# Patient Record
Sex: Male | Born: 1965 | Race: White | Hispanic: No | Marital: Married
Health system: Southern US, Community
[De-identification: ages and names within clinical notes are randomized; demographics above are authoritative.]

## PROBLEM LIST (undated history)

## (undated) DIAGNOSIS — E785 Hyperlipidemia, unspecified: Secondary | ICD-10-CM

---

## 2012-07-19 ENCOUNTER — Ambulatory Visit
Admission: RE | Admit: 2012-07-19 | Discharge: 2012-07-19 | Disposition: A | Discharge status: Home / Self Care | Payer: Self-pay | Source: Ambulatory Visit | Attending: Family Medicine | Admitting: Family Medicine

## 2012-07-19 ENCOUNTER — Other Ambulatory Visit: Payer: Self-pay | Admitting: Family Medicine

## 2012-07-19 DIAGNOSIS — M25519 Pain in unspecified shoulder: Secondary | ICD-10-CM

## 2012-07-19 DIAGNOSIS — M25569 Pain in unspecified knee: Secondary | ICD-10-CM

## 2013-10-05 IMAGING — CR DG KNEE 1-2V*L*
2 series · 2 of 2 positions shown · non-contrast
Comparison: None

CLINICAL DATA: Joint pain for 1-2 months

LEFT KNEE - 1-2 VIEW

[t knee ap left]
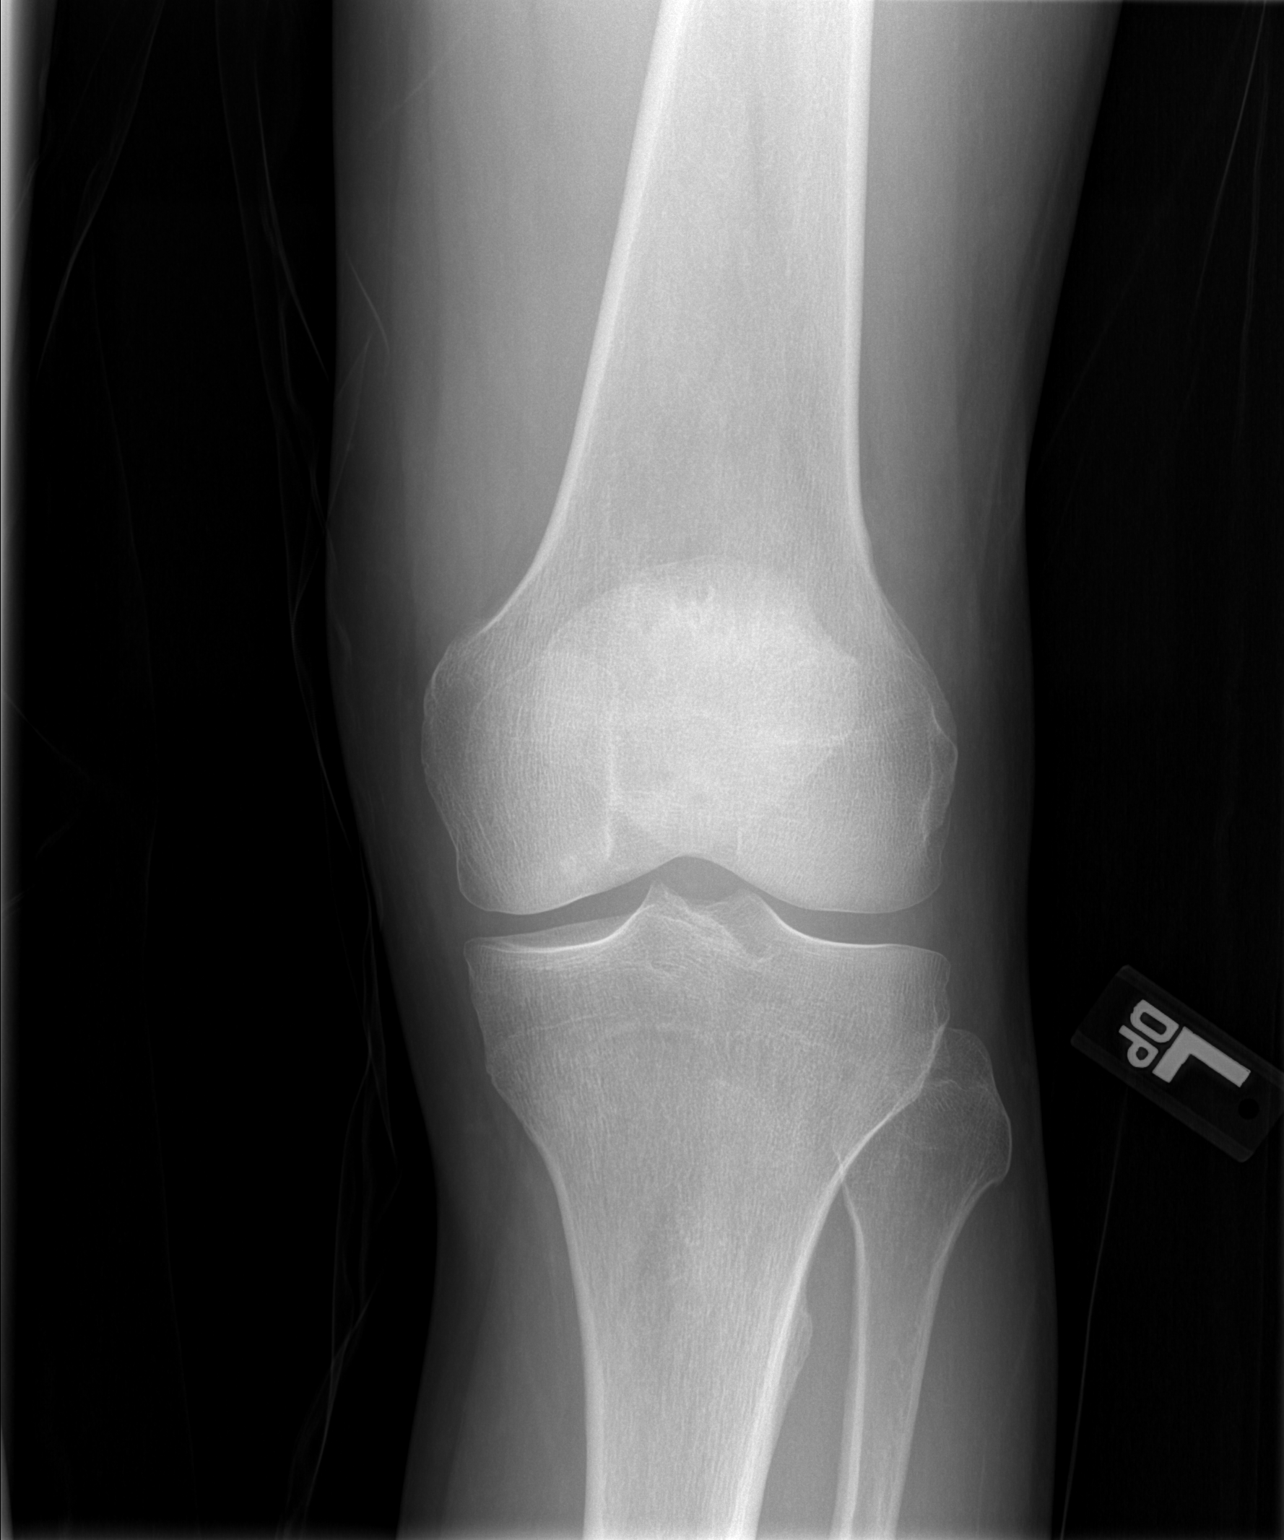

[t knee lat left]
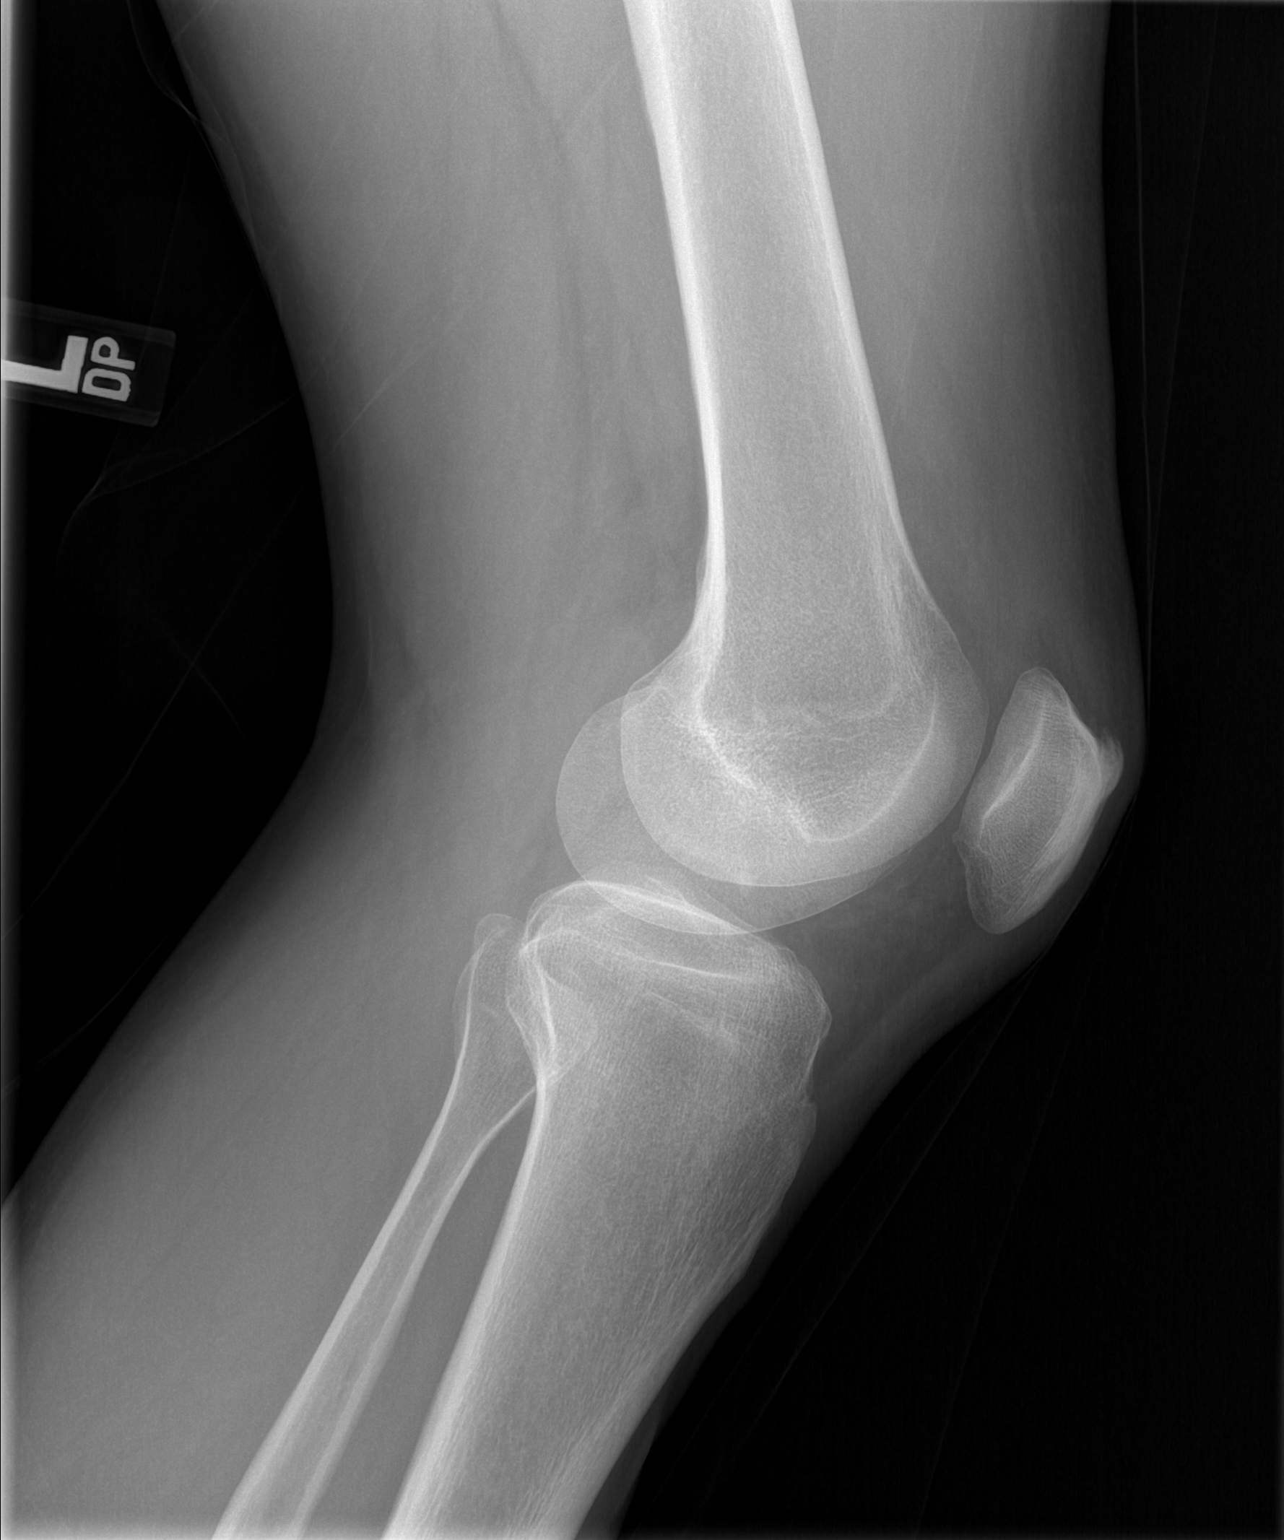

[2 of 2 positions shown; findings below may reference images not displayed]

FINDINGS: Osseous mineralization grossly normal.
Joint spaces preserved.
Small patellar spur at quadriceps tendon insertion.
No acute fracture, dislocation or bone destruction.
No knee joint effusion identified.
IMPRESSION: No acute abnormalities.

## 2013-10-05 IMAGING — CR DG SHOULDER 2+V*R*
3 series · 3 of 3 positions shown · non-contrast
Comparison: None

CLINICAL DATA: Shoulder pain for 1-2 months, no history of trauma

RIGHT SHOULDER - 2+ VIEW

[w shoulder ap internal righ]
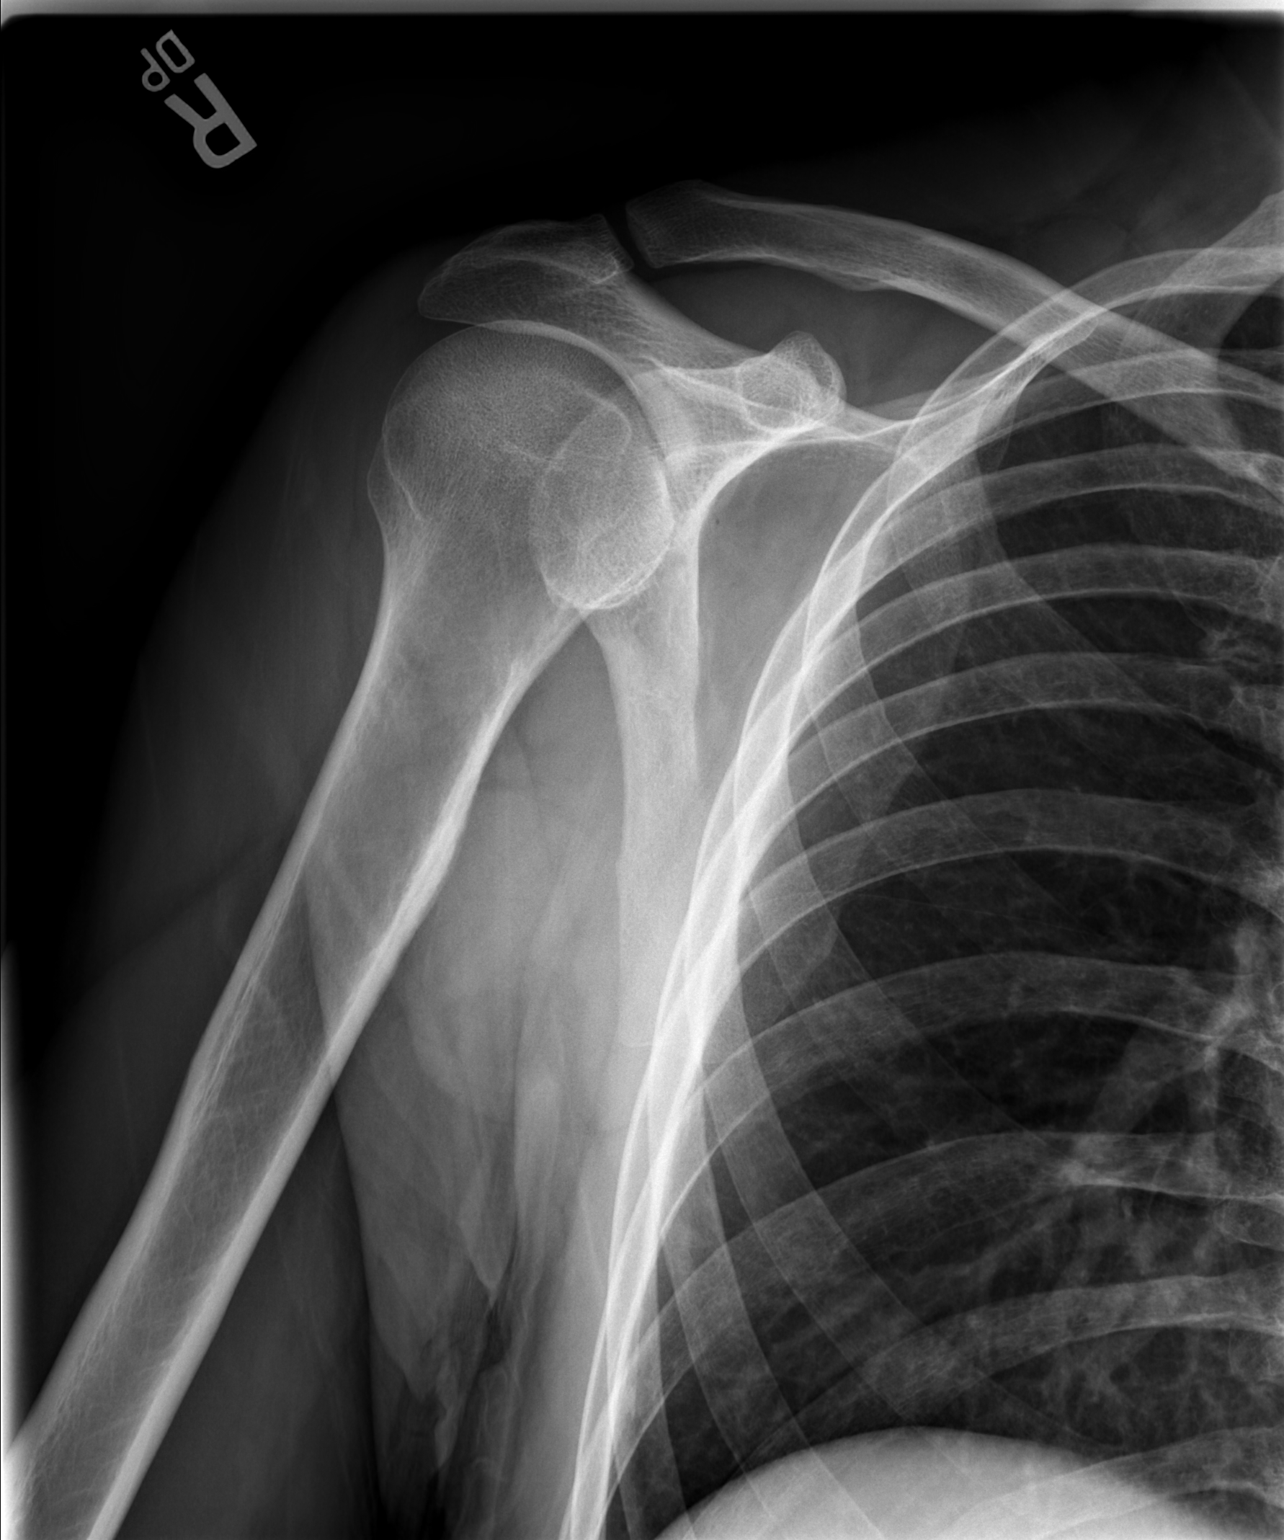

[w shoulder ap external righ]
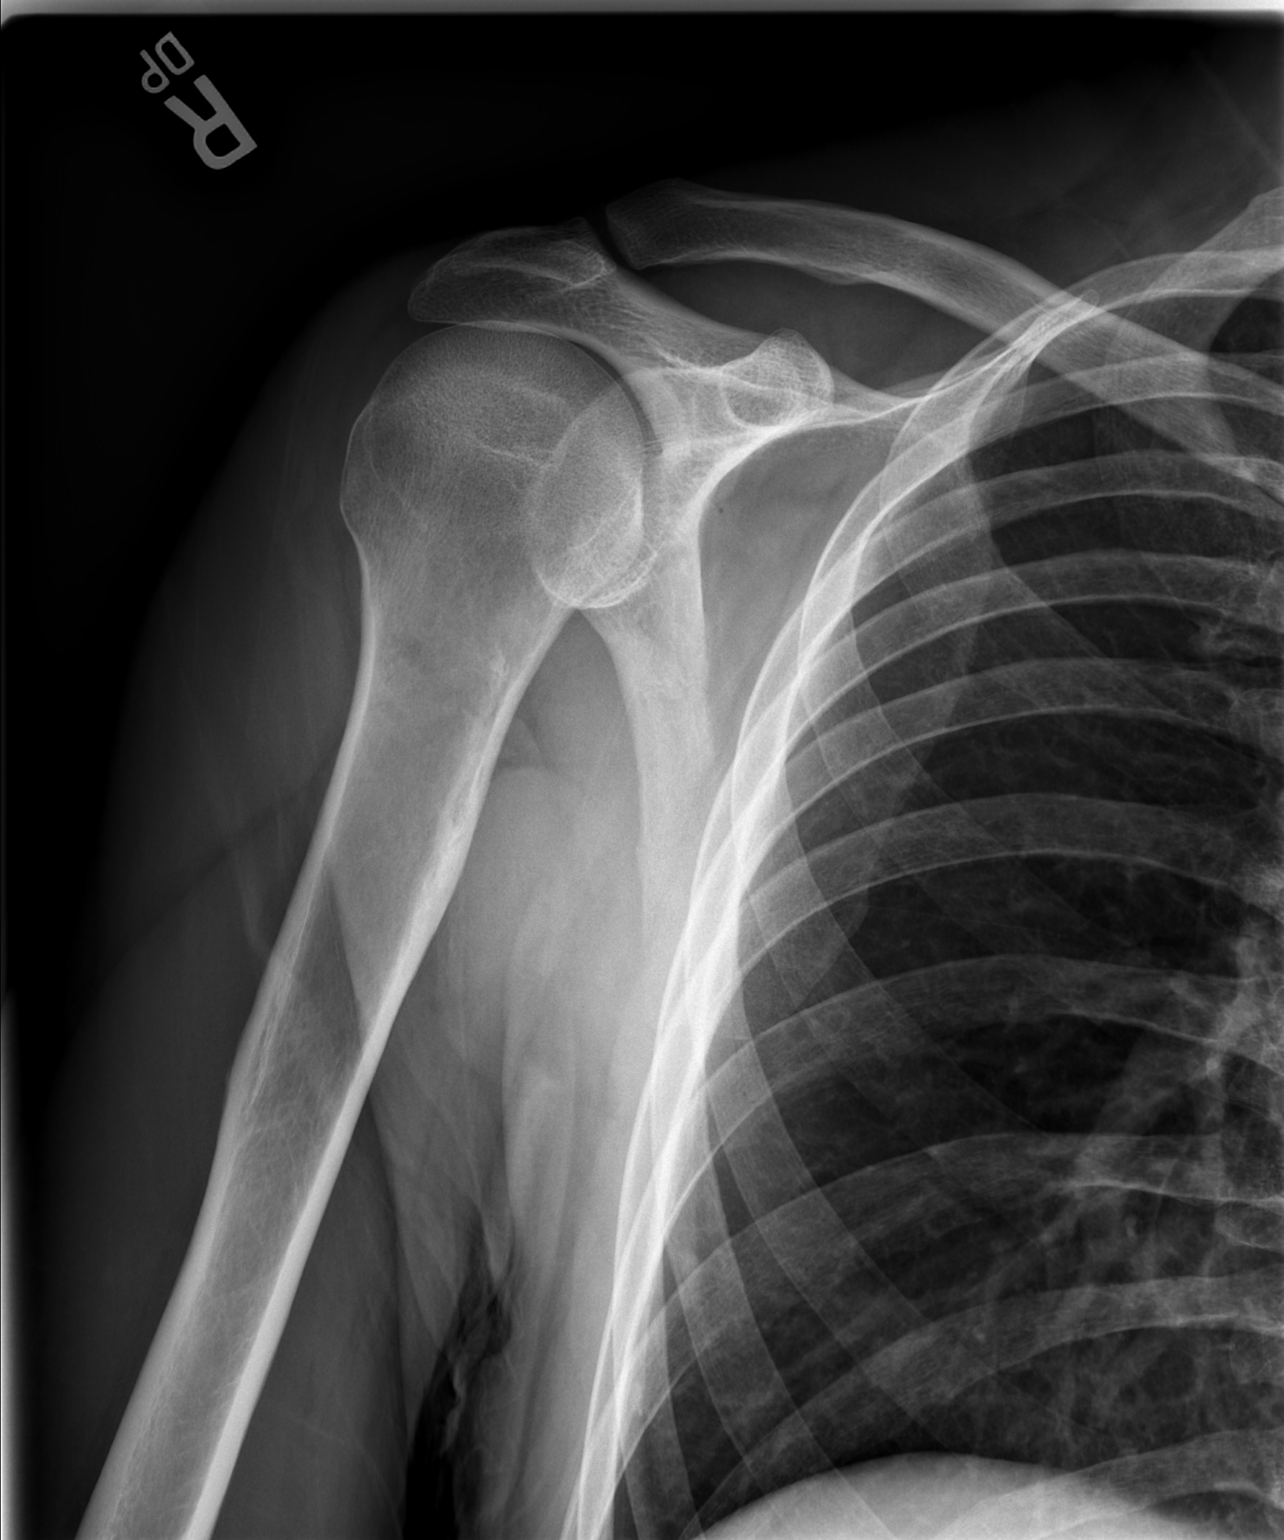

[w shoulder y view right]
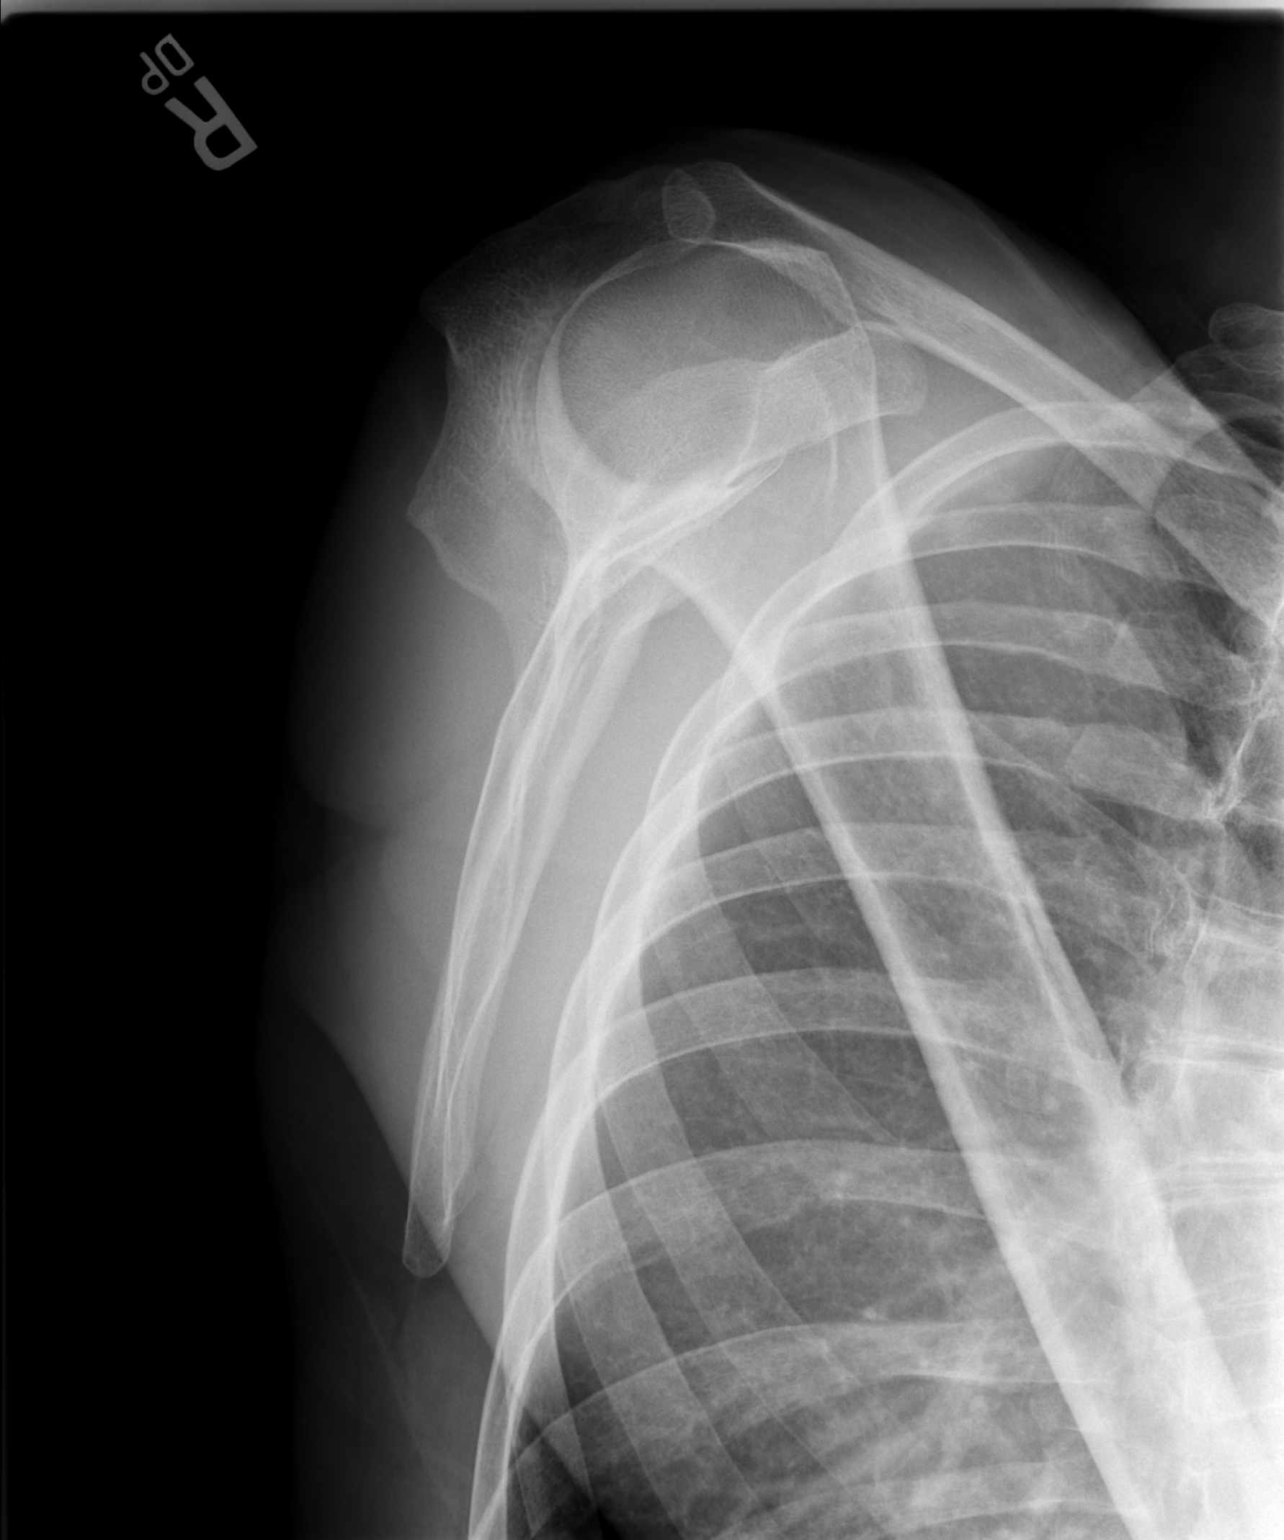

[3 of 3 positions shown; findings below may reference images not displayed]

FINDINGS: AC joint alignment normal.
Osseous mineralization normal.
No acute fracture, dislocation, or bone destruction.
Visualized right ribs intact.
IMPRESSION: No acute osseous abnormalities.

## 2018-01-26 ENCOUNTER — Other Ambulatory Visit: Payer: Self-pay

## 2018-01-26 ENCOUNTER — Emergency Department (INDEPENDENT_AMBULATORY_CARE_PROVIDER_SITE_OTHER)
Admission: EM | Admit: 2018-01-26 | Discharge: 2018-01-26 | Disposition: A | Discharge status: Home / Self Care | Payer: BLUE CROSS/BLUE SHIELD | Source: Home / Self Care | Attending: Family Medicine | Admitting: Family Medicine

## 2018-01-26 DIAGNOSIS — J069 Acute upper respiratory infection, unspecified: Secondary | ICD-10-CM | POA: Diagnosis not present

## 2018-01-26 DIAGNOSIS — B9789 Other viral agents as the cause of diseases classified elsewhere: Secondary | ICD-10-CM

## 2018-01-26 DIAGNOSIS — J302 Other seasonal allergic rhinitis: Secondary | ICD-10-CM

## 2018-01-26 HISTORY — DX: Hyperlipidemia, unspecified: E78.5

## 2018-01-26 MED ORDER — AZITHROMYCIN 250 MG PO TABS: ORAL_TABLET | ORAL | 0 refills | Status: AC

## 2018-01-26 MED ORDER — PREDNISONE 20 MG PO TABS: ORAL_TABLET | ORAL | 0 refills | Status: AC

## 2018-01-26 MED ORDER — BENZONATATE 200 MG PO CAPS: ORAL_CAPSULE | ORAL | 0 refills | Status: AC

## 2018-01-26 NOTE — Discharge Instructions (Addendum)
Take plain guaifenesin (1200mg extended release tabs such as Mucinex) twice daily, with plenty of water, for cough and congestion.  May add Pseudoephedrine (30mg, one or two every 4 to 6 hours) for sinus congestion.  Get adequate rest.   °May use Afrin nasal spray (or generic oxymetazoline) each morning for about 5 days and then discontinue.  Also recommend using saline nasal spray several times daily and saline nasal irrigation (AYR is a common brand).  Use Flonase nasal spray each morning after using Afrin nasal spray and saline nasal irrigation. °Try warm salt water gargles for sore throat.  °Stop all antihistamines for now, and other non-prescription cough/cold preparations. °May take Delsym Cough Suppressant with Tessalon at bedtime for nighttime cough.  °Begin Azithromycin if not improving about one week or if persistent fever develops   °

## 2018-01-26 NOTE — ED Provider Notes (Signed)
[facility]    CSN: [id number] Arrival date & time: 01/26/18  1000     History   Chief Complaint Chief Complaint  Patient presents with  . Cough    HPI Brent Buchanan is a 52 y.o. male.   Six days ago patient developed typical cold-like symptoms developing over several days, including mild sore throat, sinus congestion, headache, and fatigue.  He has seasonal allergic rhinitis and he believed that his allergies were beginning to flare up from pollen.  He started Flonase and Zyrtec without improvement, and two days ago he developed a non-productive cough.  No fevers, chills, and sweats.  No pleuritic pain or shortness of breath.  The history is provided by the patient.    Past Medical History:  Diagnosis Date  . Hyperlipidemia     There are no active problems to display for this patient.        Home Medications    Prior to Admission medications   Medication Sig Start Date End Date Taking? Authorizing Provider  ezetimibe (ZETIA) 10 MG tablet Take 10 mg by mouth daily.   Yes [provider]  azithromycin (ZITHROMAX Z-PAK) 250 MG tablet Take 2 tabs today; then begin one tab once daily for 4 more days. (Rx void after 02/03/18) 01/26/18   Lattie Haw, MD  benzonatate (TESSALON) 200 MG capsule Take one cap by mouth at bedtime as needed for cough.  May repeat in 4 to 6 hours 01/26/18   Lattie Haw, MD  predniSONE (DELTASONE) 20 MG tablet Take one tab by mouth twice daily for 5 days, then one daily for 3 days. Take with food. 01/26/18   Lattie Haw, MD    Family History No family history on file.  Social History Social History   Tobacco Use  . Smoking status: Not on file  Substance Use Topics  . Alcohol use: Not on file  . Drug use: Not on file     Allergies   Patient has no known allergies.   Review of Systems Review of Systems + sore throat + cough No pleuritic pain No wheezing + nasal congestion + post-nasal  drainage No sinus pain/pressure No itchy/red eyes No earache No hemoptysis No SOB No fever/chills No nausea No vomiting No abdominal pain No diarrhea No urinary symptoms No skin rash + fatigue ? myalgias + headache Used OTC meds without relief   Physical Exam Triage Vital Signs ED Triage Vitals  Enc Vitals Group     BP 01/26/18 1023 (!) 138/92     Pulse Rate 01/26/18 1023 87     Resp 01/26/18 1023 18     Temp 01/26/18 1023 97.8 F (36.6 C)     Temp Source 01/26/18 1023 Oral     SpO2 01/26/18 1023 96 %     Weight 01/26/18 1024 203 lb (92.1 kg)     Height --      Head Circumference --      Peak Flow --      Pain Score 01/26/18 1024 0     Pain Loc --      Pain Edu? --      Excl. in GC? --    No data found.  Updated Vital Signs BP (!) 138/92 (BP Location: Right Arm)   Pulse 87   Temp 97.8 F (36.6 C) (Oral)   Resp 18   Wt 203 lb (92.1 kg)   SpO2 96%   Visual Acuity Right Eye Distance:  Left Eye Distance:   Bilateral Distance:    Right Eye Near:   Left Eye Near:    Bilateral Near:     Physical Exam Nursing notes and Vital Signs reviewed. Appearance:  Patient appears stated age, and in no acute distress Eyes:  Pupils are equal, round, and reactive to light and accomodation.  Extraocular movement is intact.  Conjunctivae are not inflamed  Ears:  Canals normal.  Tympanic membranes normal.  Nose:  Mildly congested turbinates.  No sinus tenderness.   Pharynx:  Normal Neck:  Supple.  Enlarged posterior/lateral nodes are palpated bilaterally, tender to palpation on the left.   Lungs:  Clear to auscultation.  Breath sounds are equal.  Moving air well. Heart:  Regular rate and rhythm without murmurs, rubs, or gallops.  Abdomen:  Nontender without masses or hepatosplenomegaly.  Bowel sounds are present.  No CVA or flank tenderness.  Extremities:  No edema.  Skin:  No rash present.    UC Treatments / Results  Labs (all labs ordered are listed, but only  abnormal results are displayed) Labs Reviewed - No data to display  EKG None Radiology No results found.  Procedures Procedures (including critical care time)  Medications Ordered in UC Medications - No data to display   Initial Impression / Assessment and Plan / UC Course  I have reviewed the triage vital signs and the nursing notes.  Pertinent labs & imaging results that were available during my care of the patient were reviewed by me and considered in my medical decision making (see chart for details).    There is no evidence of bacterial infection today.  Begin prednisone burst/taper. Prescription written for Benzonatate [facility](Tessalon) to take at bedtime for night-time cough.  Take plain guaifenesin (1200mg  extended release tabs such as Mucinex) twice daily, with plenty of water, for cough and congestion.  May add Pseudoephedrine (30mg , one or two every 4 to 6 hours) for sinus congestion.  Get adequate rest.   May use Afrin nasal spray (or generic oxymetazoline) each morning for about 5 days and then discontinue.  Also recommend using saline nasal spray several times daily and saline nasal irrigation (AYR is a common brand).  Use Flonase nasal spray each morning after using Afrin nasal spray and saline nasal irrigation. Try warm salt water gargles for sore throat.  Stop all antihistamines for now, and other non-prescription cough/cold preparations. May take Delsym Cough Suppressant with Tessalon at bedtime for nighttime cough.  Begin Azithromycin if not improving about one week or if persistent fever develops (Given a prescription to hold, with an expiration date)  Followup with Family Doctor if not improved in about 10 days.    Final Clinical Impressions(s) / UC Diagnoses   Final diagnoses:  Viral URI with cough  Seasonal allergic rhinitis, unspecified trigger    ED Discharge Orders        Ordered    predniSONE (DELTASONE) 20 MG tablet     01/26/18 1048    benzonatate  (TESSALON) 200 MG capsule     01/26/18 1048    azithromycin (ZITHROMAX Z-PAK) 250 MG tablet     01/26/18 1049          Lattie HawBeese, Jumaane Weatherford A, MD 01/26/18 1056

## 2018-01-26 NOTE — ED Triage Notes (Signed)
Pt c/o cold x 1 week. No fever. Nasal congestion and productive cough with green phlegm. Has been taking Zyrtec and started Robitussin on Monday.
